# Patient Record
Sex: Female | Born: 1985 | Race: Black or African American | Hispanic: No | Marital: Single | Smoking: Never smoker
Health system: Southern US, Community
[De-identification: ages and names within clinical notes are randomized; demographics above are authoritative.]

---

## 2007-03-09 ENCOUNTER — Emergency Department (HOSPITAL_COMMUNITY): Admission: EM | Admit: 2007-03-09 | Discharge: 2007-03-09 | Payer: Self-pay | Admitting: Emergency Medicine

## 2009-04-07 ENCOUNTER — Emergency Department (HOSPITAL_COMMUNITY): Admission: EM | Admit: 2009-04-07 | Discharge: 2009-04-08 | Payer: Self-pay | Admitting: Emergency Medicine

## 2010-08-16 IMAGING — US US RENAL
1 series · 14 of 25 positions shown · non-contrast
Comparison: None

CLINICAL DATA: Abdominal pain, pregnant, question hydronephrosis

RENAL/URINARY TRACT ULTRASOUND COMPLETE

[Series 1: us renal · 0.28mm/px · 68 acquisitions, 14 frames shown]
[im 1/68]
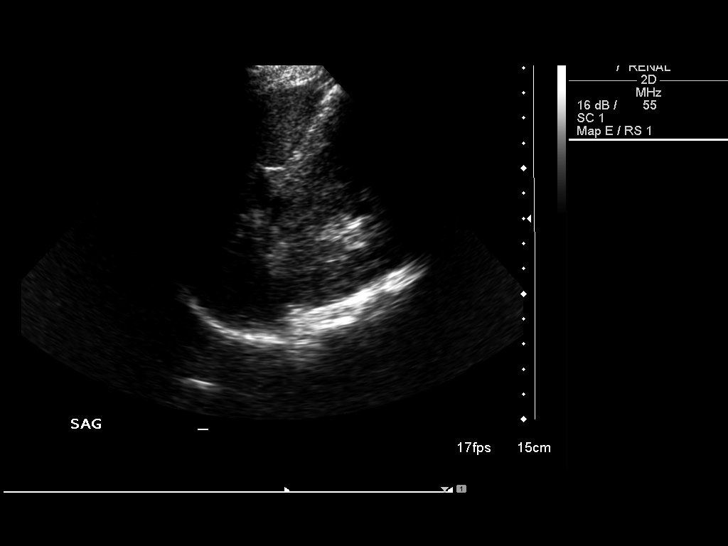
[im 6/68]
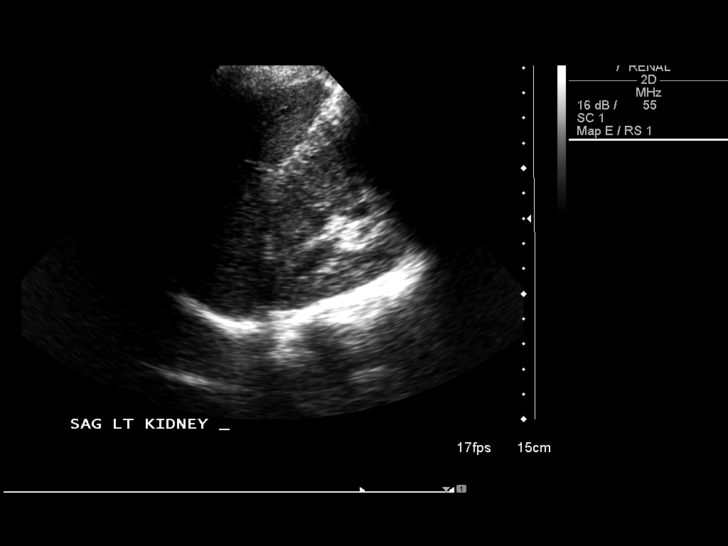
[im 12/68]
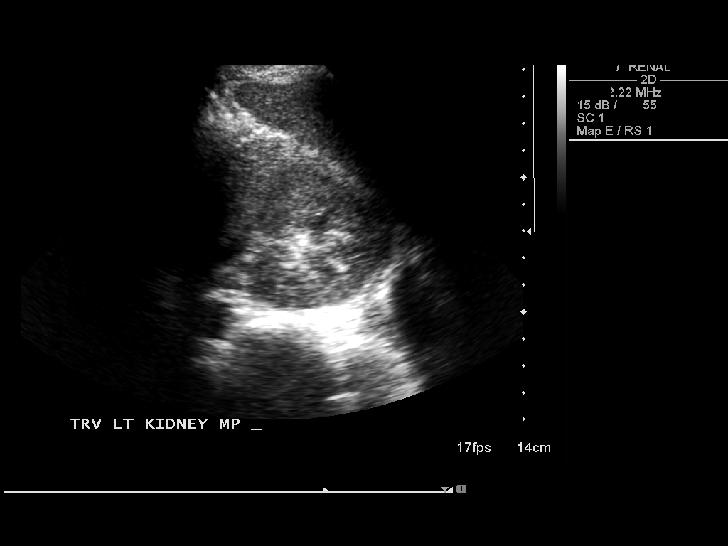
[im 17/68]
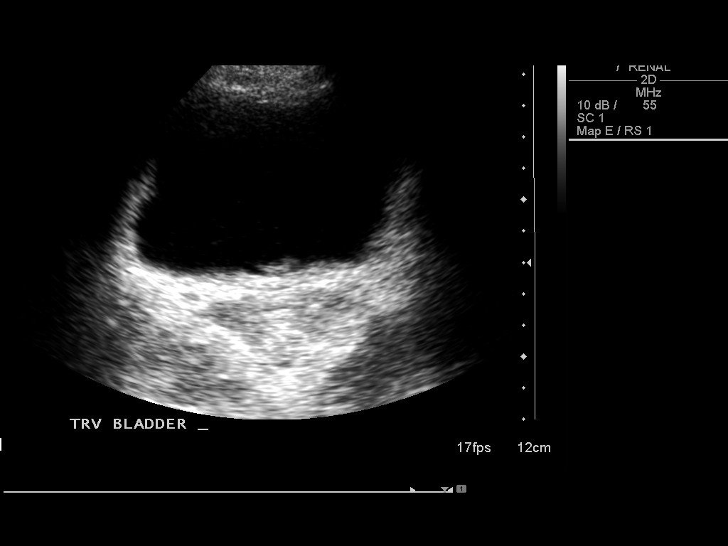
[im 23/68]
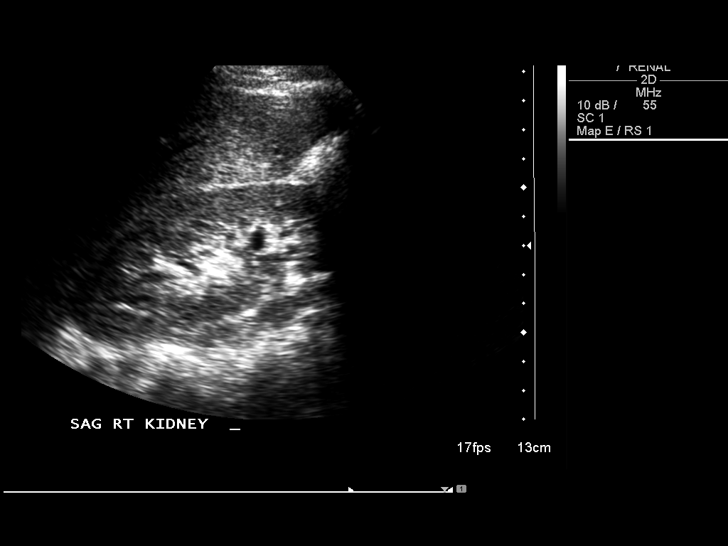
[im 26/68]
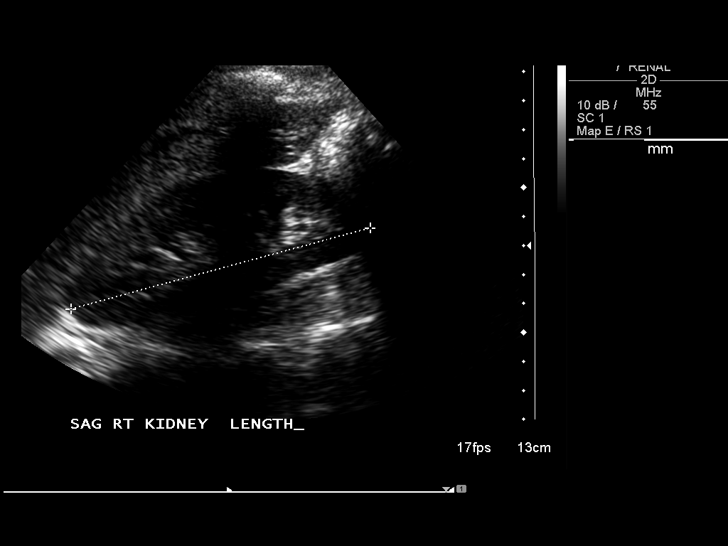
[im 31/68]
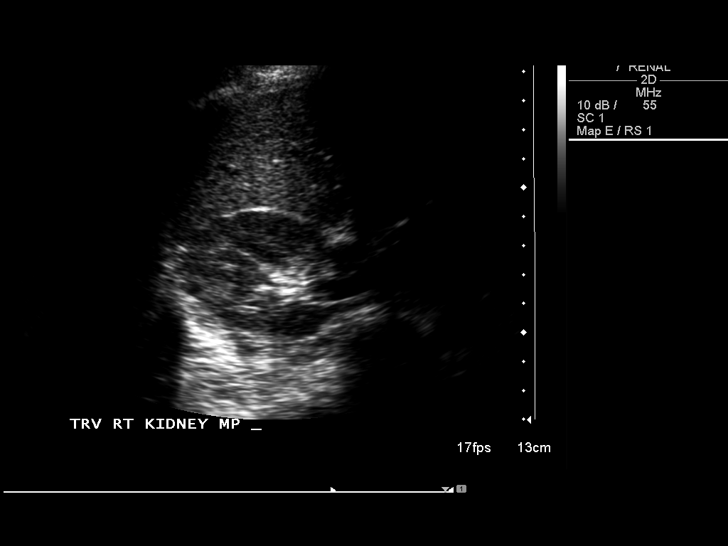
[im 37/68]
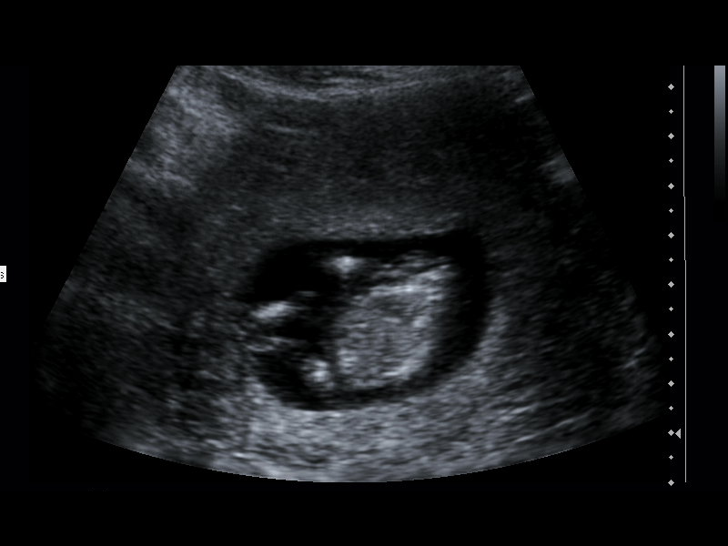
[im 42/68]
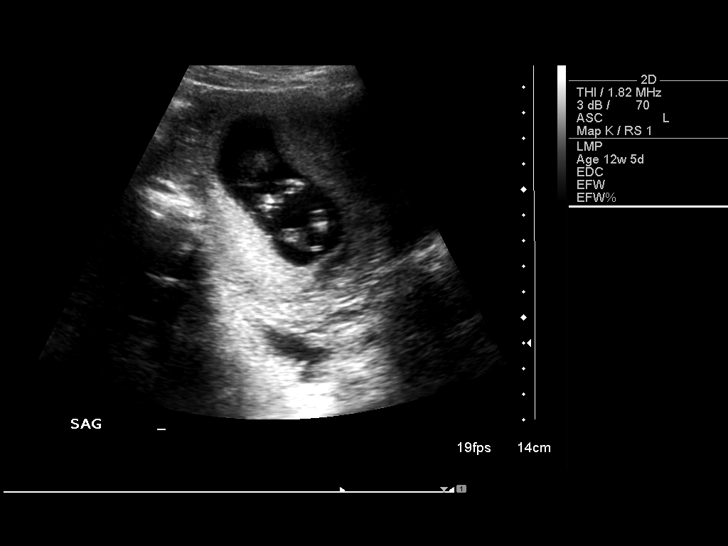
[im 45/68]
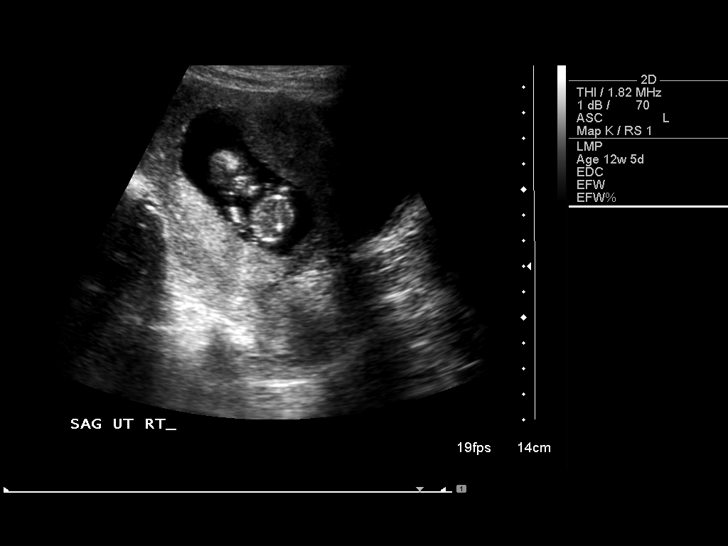
[im 51/68]
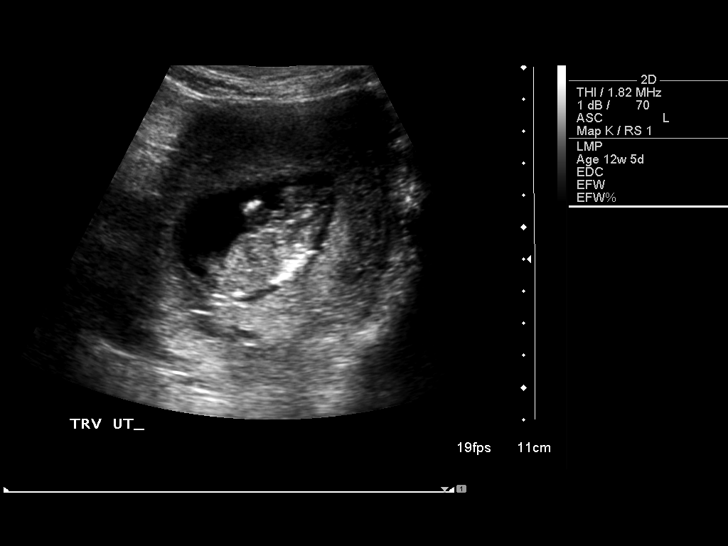
[im 56/68]
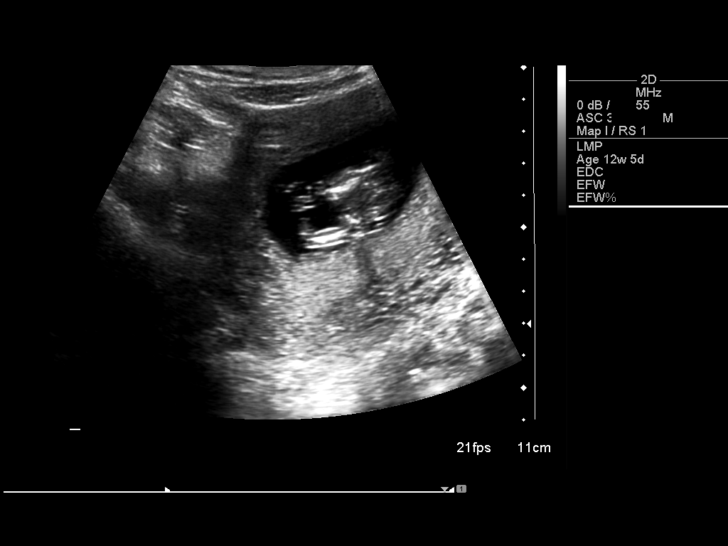
[im 62/68]
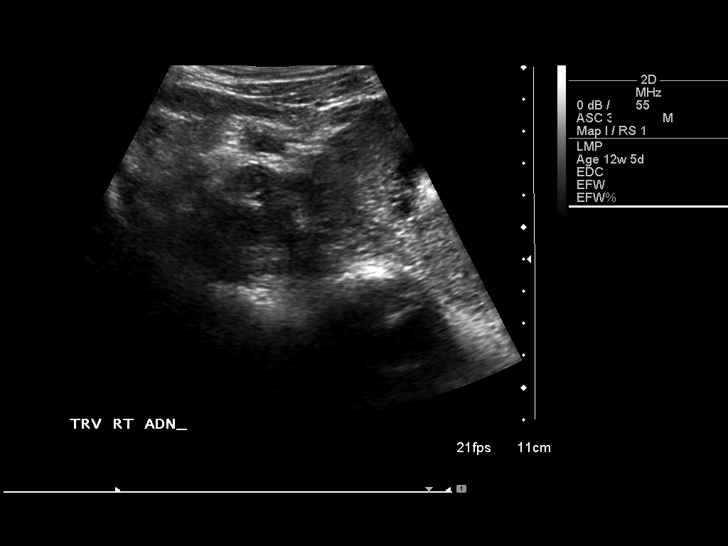
[im 68/68]
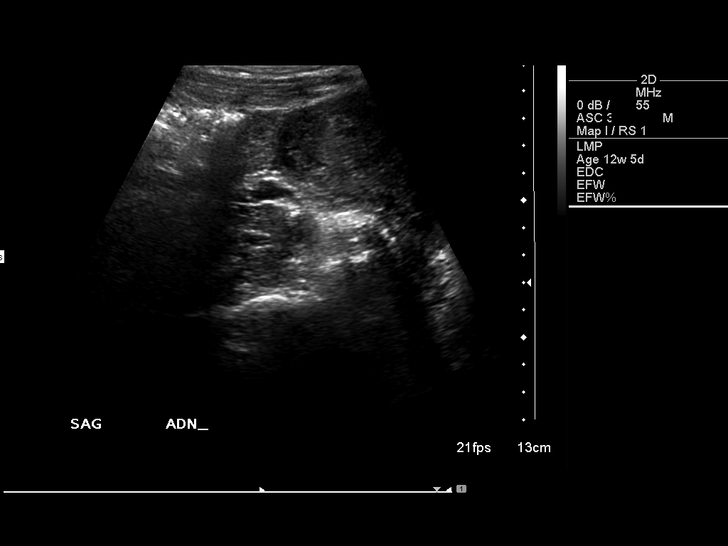

[14 of 25 positions shown; findings below may reference images not displayed]

FINDINGS: Right Kidney:  10.7 cm length.  No mass or hydronephrosis.  Normal
cortical thickness and echogenicity.

Left Kidney:  11.4 cm length.  Normal cortical thickness
echogenicity.  No mass or hydronephrosis.

Bladder:  Unremarkable
IMPRESSION: Normal renal ultrasound.

## 2010-09-20 LAB — URINE CULTURE: Colony Count: >=100000

## 2010-09-20 LAB — PREGNANCY, URINE: Preg Test, Ur: POSITIVE

## 2010-09-20 LAB — BASIC METABOLIC PANEL
CO2: 21 mEq/L (ref 19–32)
Chloride: 108 mEq/L (ref 96–112)
GFR calc Af Amer: >60 mL/min (ref >60)
GFR calc non Af Amer: >60 mL/min (ref >60)
Glucose, Bld: 110 mg/dL — ABNORMAL HIGH (ref 70–99)
Potassium: 3.4 mEq/L — ABNORMAL LOW (ref 3.5–5.1)

## 2010-09-20 LAB — URINALYSIS, ROUTINE W REFLEX MICROSCOPIC
Glucose, UA: NEGATIVE mg/dL
Ketones, ur: NEGATIVE mg/dL
Protein, ur: 100 mg/dL — AB
Specific Gravity, Urine: 1.025 (ref 1.005–1.030)
Urobilinogen, UA: 1 mg/dL (ref 0.0–1.0)

## 2010-09-20 LAB — DIFFERENTIAL
Lymphocytes Relative: 9 % — ABNORMAL LOW (ref 12–46)
Lymphs Abs: 0.2 10*3/uL — ABNORMAL LOW (ref 0.7–4.0)
Monocytes Relative: 0 % — ABNORMAL LOW (ref 3–12)
Neutrophils Relative %: 91 % — ABNORMAL HIGH (ref 43–77)

## 2010-09-20 LAB — URINE MICROSCOPIC-ADD ON

## 2010-09-20 LAB — GC/CHLAMYDIA PROBE AMP, GENITAL
Chlamydia, DNA Probe: NEGATIVE
GC Probe Amp, Genital: NEGATIVE

## 2010-09-20 LAB — CBC
RDW: 13.5 % (ref 11.5–15.5)
WBC: 1.7 10*3/uL — ABNORMAL LOW (ref 4.0–10.5)

## 2010-09-20 LAB — WET PREP, GENITAL: Trich, Wet Prep: NONE SEEN

## 2018-03-10 ENCOUNTER — Encounter (HOSPITAL_COMMUNITY): Payer: Self-pay

## 2018-03-10 ENCOUNTER — Emergency Department (HOSPITAL_COMMUNITY)
Admission: EM | Admit: 2018-03-10 | Discharge: 2018-03-10 | Disposition: A | Discharge status: Home / Self Care | Payer: No Typology Code available for payment source | Attending: Emergency Medicine | Admitting: Emergency Medicine

## 2018-03-10 ENCOUNTER — Other Ambulatory Visit: Payer: Self-pay

## 2018-03-10 DIAGNOSIS — T148XXA Other injury of unspecified body region, initial encounter: Secondary | ICD-10-CM

## 2018-03-10 DIAGNOSIS — Y998 Other external cause status: Secondary | ICD-10-CM | POA: Insufficient documentation

## 2018-03-10 DIAGNOSIS — Y939 Activity, unspecified: Secondary | ICD-10-CM | POA: Diagnosis not present

## 2018-03-10 DIAGNOSIS — Y92410 Unspecified street and highway as the place of occurrence of the external cause: Secondary | ICD-10-CM | POA: Insufficient documentation

## 2018-03-10 DIAGNOSIS — S161XXA Strain of muscle, fascia and tendon at neck level, initial encounter: Secondary | ICD-10-CM | POA: Diagnosis not present

## 2018-03-10 DIAGNOSIS — S39012A Strain of muscle, fascia and tendon of lower back, initial encounter: Secondary | ICD-10-CM | POA: Diagnosis not present

## 2018-03-10 DIAGNOSIS — S199XXA Unspecified injury of neck, initial encounter: Secondary | ICD-10-CM | POA: Diagnosis present

## 2018-03-10 MED ORDER — CYCLOBENZAPRINE HCL 5 MG PO TABS: 5.0000 mg | ORAL_TABLET | Freq: Three times a day (TID) | ORAL | 0 refills | Status: AC | PRN

## 2018-03-10 MED ORDER — IBUPROFEN 400 MG PO TABS
400.0000 mg | ORAL_TABLET | Freq: Once | ORAL | Status: AC
  Administered 2018-03-10: 400 mg via ORAL
  Filled 2018-03-10: qty 1

## 2018-03-10 MED ORDER — IBUPROFEN 600 MG PO TABS: 600.0000 mg | ORAL_TABLET | Freq: Four times a day (QID) | ORAL | 0 refills | Status: AC | PRN
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
## 2024-06-12 ENCOUNTER — Encounter (HOSPITAL_COMMUNITY): Payer: Self-pay | Admitting: Emergency Medicine

## 2024-06-12 ENCOUNTER — Emergency Department (HOSPITAL_COMMUNITY)
Admission: EM | Admit: 2024-06-12 | Discharge: 2024-06-12 | Disposition: A | Discharge status: Home / Self Care | Payer: Self-pay | Attending: Emergency Medicine | Admitting: Emergency Medicine

## 2024-06-12 ENCOUNTER — Emergency Department (HOSPITAL_COMMUNITY): Payer: Self-pay

## 2024-06-12 ENCOUNTER — Other Ambulatory Visit: Payer: Self-pay

## 2024-06-12 DIAGNOSIS — R000 Tachycardia, unspecified: Secondary | ICD-10-CM | POA: Insufficient documentation

## 2024-06-12 DIAGNOSIS — N390 Urinary tract infection, site not specified: Secondary | ICD-10-CM | POA: Insufficient documentation

## 2024-06-12 LAB — URINALYSIS, ROUTINE W REFLEX MICROSCOPIC
Bilirubin Urine: NEGATIVE
Glucose, UA: NEGATIVE mg/dL
Hgb urine dipstick: NEGATIVE
Ketones, ur: 5 mg/dL — AB
Nitrite: POSITIVE — AB
Protein, ur: NEGATIVE mg/dL
Specific Gravity, Urine: 1.02 (ref 1.005–1.030)
pH: 5 (ref 5.0–8.0)

## 2024-06-12 LAB — BASIC METABOLIC PANEL WITH GFR
Anion gap: 15 (ref 5–15)
BUN: 10 mg/dL (ref 6–20)
CO2: 18 mmol/L — ABNORMAL LOW (ref 22–32)
Calcium: 8.9 mg/dL (ref 8.9–10.3)
Chloride: 105 mmol/L (ref 98–111)
Creatinine, Ser: 0.75 mg/dL (ref 0.44–1.00)
GFR, Estimated: >60 mL/min
Glucose, Bld: 91 mg/dL (ref 70–99)
Potassium: 4.5 mmol/L (ref 3.5–5.1)
Sodium: 137 mmol/L (ref 135–145)

## 2024-06-12 LAB — CBC
HCT: 36.7 % (ref 36.0–46.0)
Hemoglobin: 11.3 g/dL — ABNORMAL LOW (ref 12.0–15.0)
MCH: 25.3 pg — ABNORMAL LOW (ref 26.0–34.0)
MCHC: 30.8 g/dL (ref 30.0–36.0)
MCV: 82.3 fL (ref 80.0–100.0)
Platelets: 279 K/uL (ref 150–400)
RBC: 4.46 MIL/uL (ref 3.87–5.11)
RDW: 14.5 % (ref 11.5–15.5)
WBC: 8.6 K/uL (ref 4.0–10.5)
nRBC: 0 % (ref 0.0–0.2)

## 2024-06-12 LAB — PREGNANCY, URINE: Preg Test, Ur: NEGATIVE

## 2024-06-12 MED ORDER — CEPHALEXIN 500 MG PO CAPS: 1000.0000 mg | ORAL_CAPSULE | Freq: Two times a day (BID) | ORAL | 0 refills | Status: AC

## 2024-06-12 MED ORDER — CEPHALEXIN 500 MG PO CAPS
1000.0000 mg | ORAL_CAPSULE | Freq: Once | ORAL | Status: AC
  Administered 2024-06-12: 1000 mg via ORAL
  Filled 2024-06-12: qty 2

## 2024-06-12 MED ORDER — SODIUM CHLORIDE 0.9 % IV BOLUS: 1000.0000 mL | Freq: Once | INTRAVENOUS | Status: DC

## 2024-06-12 MED ORDER — CEPHALEXIN 500 MG PO CAPS: 1000.0000 mg | ORAL_CAPSULE | Freq: Two times a day (BID) | ORAL | 0 refills | Status: DC

## 2024-06-12 MED ORDER — KETOROLAC TROMETHAMINE 30 MG/ML IJ SOLN
15.0000 mg | Freq: Once | INTRAMUSCULAR | Status: DC
  Filled 2024-06-12: qty 1

## 2024-06-12 MED ORDER — IBUPROFEN 400 MG PO TABS
600.0000 mg | ORAL_TABLET | Freq: Once | ORAL | Status: AC
  Administered 2024-06-12: 600 mg via ORAL

## 2024-06-12 NOTE — ED Provider Notes (Signed)
 " [place] EMERGENCY DEPARTMENT AT [facility] Provider Note  CSN: [id number] Arrival date & time: 06/12/24 1332  Chief Complaint(s) Urinary Frequency and Flank Pain  HPI Jamie Kent is a 38 y.o. female without significant past medical history presenting to the emergency department with urinary symptoms.  Reports urinary symptoms since yesterday, also reports associated onset of left-sided flank pain which radiates to the groin.  Reports dysuria and frequency.  No fevers, reports some chills.  No nausea or vomiting.  Pain is sharp.  No diarrhea.  No sore throat or runny nose.  Feels like prior UTI, but flank pain new.  Denies history of kidney stone.   Past Medical History History reviewed. No pertinent past medical history. There are no active problems to display for this patient.  Home Medication(s) Prior to Admission medications  Medication Sig Start Date End Date Taking? Authorizing Provider  cephALEXin  (KEFLEX ) 500 MG capsule Take 2 capsules (1,000 mg total) by mouth 2 (two) times daily for 10 days. 06/12/24 06/22/24 Yes Francesca Elsie CROME, MD  cyclobenzaprine  (FLEXERIL ) 5 MG tablet Take 1 tablet (5 mg total) by mouth 3 (three) times daily as needed for muscle spasms. 03/10/18   Idol, Julie, PA-C  ibuprofen  (ADVIL ,MOTRIN ) 600 MG tablet Take 1 tablet (600 mg total) by mouth every 6 (six) hours as needed. 03/10/18   Birdena Mliss RIGGERS                                                                                                                                    Past Surgical History History reviewed. No pertinent surgical history. Family History History reviewed. No pertinent family history.  Social History Social History[1] Allergies Patient has no known allergies.  Review of Systems Review of Systems  All other systems reviewed and are negative.   Physical Exam Vital Signs  I have reviewed the triage vital signs BP (!) 130/90   Pulse 92   Temp 99.7 F (37.6  C) (Oral)   Resp 18   Ht 5' 8 (1.727 m)   Wt 86.2 kg   LMP 05/30/2024 (Approximate)   SpO2 100%   BMI 28.89 kg/m  Physical Exam Vitals and nursing note reviewed.  Constitutional:      General: She is not in acute distress.    Appearance: She is well-developed.  HENT:     Head: Normocephalic and atraumatic.     Mouth/Throat:     Mouth: Mucous membranes are moist.  Eyes:     Pupils: Pupils are equal, round, and reactive to light.  Cardiovascular:     Rate and Rhythm: Regular rhythm. Tachycardia present.     Heart sounds: No murmur heard. Pulmonary:     Effort: Pulmonary effort is normal. No respiratory distress.     Breath sounds: Normal breath sounds.  Abdominal:     General: Abdomen is flat.     Palpations: Abdomen is soft.  Tenderness: There is no abdominal tenderness. There is left CVA tenderness. There is no right CVA tenderness.  Musculoskeletal:        General: No tenderness.     Right lower leg: No edema.     Left lower leg: No edema.  Skin:    General: Skin is warm and dry.  Neurological:     General: No focal deficit present.     Mental Status: She is alert. Mental status is at baseline.  Psychiatric:        Mood and Affect: Mood normal.        Behavior: Behavior normal.     ED Results and Treatments Labs (all labs ordered are listed, but only abnormal results are displayed) Labs Reviewed  URINALYSIS, ROUTINE W REFLEX MICROSCOPIC - Abnormal; Notable for the following components:      Result Value   APPearance CLOUDY (*)    Ketones, ur 5 (*)    Nitrite POSITIVE (*)    Leukocytes,Ua TRACE (*)    Bacteria, UA RARE (*)    All other components within normal limits  BASIC METABOLIC PANEL WITH GFR - Abnormal; Notable for the following components:   CO2 18 (*)    All other components within normal limits  CBC - Abnormal; Notable for the following components:   Hemoglobin 11.3 (*)    MCH 25.3 (*)    All other components within normal limits   PREGNANCY, URINE                                                                                                                          Radiology CT Renal Stone Study Result Date: 06/12/2024 EXAM: CT UROGRAM 06/12/2024 05:38:05 PM TECHNIQUE: CT of the abdomen and pelvis was performed without the administration of intravenous contrast as per CT urogram protocol. Multiplanar reformatted images as well as MIP urogram images are provided for review. Automated exposure control, iterative reconstruction, and/or weight based adjustment of the mA/kV was utilized to reduce the radiation dose to as low as reasonably achievable. COMPARISON: None available. CLINICAL HISTORY: Abdominal/flank pain, stone suspected. FINDINGS: LOWER CHEST: No acute abnormality. LIVER: The liver is unremarkable. GALLBLADDER AND BILE DUCTS: Gallbladder is unremarkable. No biliary ductal dilatation. SPLEEN: No acute abnormality. PANCREAS: No acute abnormality. ADRENAL GLANDS: No acute abnormality. KIDNEYS, URETERS AND BLADDER: No stones in the kidneys or ureters. No hydronephrosis. No perinephric or periureteral stranding. Urinary bladder is unremarkable. GI AND BOWEL: Stomach demonstrates no acute abnormality. Appendix is normal. There is no bowel obstruction. PERITONEUM AND RETROPERITONEUM: No ascites. No free air. VASCULATURE: Aorta is normal in caliber. LYMPH NODES: No lymphadenopathy. REPRODUCTIVE ORGANS: No acute abnormality. BONES AND SOFT TISSUES: No acute osseous abnormality. There is a small fat containing umbilical hernia. IMPRESSION: 1. No nephrolithiasis, obstructive uropathy, or urothelial mass. Electronically signed by: Greig Pique MD 06/12/2024 06:49 PM EST RP Workstation: [id number]    Pertinent labs & imaging results that were available during my care of  the patient were reviewed by me and considered in my medical decision making (see MDM for details).  Medications Ordered in ED Medications  cephALEXin  (KEFLEX )  capsule 1,000 mg (has no administration in time range)  ibuprofen  (ADVIL ) tablet 600 mg (600 mg Oral Given 06/12/24 1641)                                                                                                                                     Procedures Procedures  (including critical care time)  Medical Decision Making / ED Course   MDM:  38 year old presenting to the emergency department with urinary symptoms, flank pain.  Differential includes pyelonephritis, UTI, kidney stone, abscess, intra-abdominal process.  Will check urinalysis and basic lab testing.  Will also obtain CT renal stone study given flank pain radiating to the groin.  Will give medication and IV fluids given tachycardia.  Clinical Course as of 06/12/24 1918  Sat Jun 12, 2024  1916 CT scan negative.  Urinalysis is contaminated with squamous cells but is nitrate leukocyte positive with bacteria.  Will start on Keflex .  Will give dose in ER.  Will treat for 10 days given some flank pain suggestive of possible ascending infection. Will discharge patient to home. All questions answered. Patient comfortable with plan of discharge. Return precautions discussed with patient and specified on the after visit summary.  [WS]    Clinical Course User Index [WS] Francesca Elsie CROME, MD     Additional history obtained:  -External records from outside source obtained and reviewed including: Chart review including previous notes, labs, imaging, consultation notes including prior notes    Lab Tests: -I ordered, reviewed, and interpreted labs.   The pertinent results include:   Labs Reviewed  URINALYSIS, ROUTINE W REFLEX MICROSCOPIC - Abnormal; Notable for the following components:      Result Value   APPearance CLOUDY (*)    Ketones, ur 5 (*)    Nitrite POSITIVE (*)    Leukocytes,Ua TRACE (*)    Bacteria, UA RARE (*)    All other components within normal limits  BASIC METABOLIC PANEL WITH GFR - Abnormal;  Notable for the following components:   CO2 18 (*)    All other components within normal limits  CBC - Abnormal; Notable for the following components:   Hemoglobin 11.3 (*)    MCH 25.3 (*)    All other components within normal limits  PREGNANCY, URINE    Notable for signs of UTI   Imaging Studies ordered: I ordered imaging studies including CT scan On my interpretation imaging demonstrates no acute process I independently visualized and interpreted imaging. I agree with the radiologist interpretation   Medicines ordered and prescription drug management: Meds ordered this encounter  Medications   DISCONTD: sodium chloride  0.9 % bolus 1,000 mL   DISCONTD: ketorolac  (TORADOL ) 30 MG/ML injection 15 mg   ibuprofen  (ADVIL ) tablet 600 mg  cephALEXin  (KEFLEX ) 500 MG capsule    Sig: Take 2 capsules (1,000 mg total) by mouth 2 (two) times daily for 10 days.    Dispense:  40 capsule    Refill:  0   cephALEXin  (KEFLEX ) capsule 1,000 mg    -I have reviewed the patients home medicines and have made adjustments as needed    Reevaluation: After the interventions noted above, I reevaluated the patient and found that their symptoms have improved  Co morbidities that complicate the patient evaluation History reviewed. No pertinent past medical history.    Dispostion: Disposition decision including need for hospitalization was considered, and patient discharged from emergency department.    Final Clinical Impression(s) / ED Diagnoses Final diagnoses:  Urinary tract infection without hematuria, site unspecified     This chart was dictated using voice recognition software.  Despite best efforts to proofread,  errors can occur which can change the documentation meaning.     [1]  Social History Tobacco Use   Smoking status: Never  Substance Use Topics   Alcohol use: Not Currently   Drug use: Never     Francesca Elsie CROME, MD 06/12/24 1918  "

## 2024-06-12 NOTE — ED Triage Notes (Signed)
 Pov c/o UTI sx that started last night. Endorses headaches/leg aches. Pt ambulatory and NAD

## 2024-06-12 NOTE — Discharge Instructions (Signed)
 We evaluated you for your urinary symptoms.  Your CT scan did not show any dangerous findings.  Your laboratory testing was reassuring.  Your urinary testing did show signs of a urine infection.  Please take Tylenol (acetaminophen) and Motrin  (ibuprofen ) for your symptoms at home.  You can take 1000 mg of Tylenol every 6 hours and 600 mg of Motrin  every 6 hours as needed for your symptoms.  You can take these medicines together as needed, either at the same time, or alternating every 3 hours.  Take antibiotics as prescribed.  If you have any worsening symptoms such as fevers, vomiting, abdominal pain, or any other new symptoms, please return to the emergency department.
